# Patient Record
Sex: Female | Born: 1955 | Race: White | Hispanic: No | Smoking: Current every day smoker
Health system: Southern US, Community
[De-identification: ages and names within clinical notes are randomized; demographics above are authoritative.]

## PROBLEM LIST (undated history)

## (undated) DIAGNOSIS — E079 Disorder of thyroid, unspecified: Secondary | ICD-10-CM

## (undated) DIAGNOSIS — F32A Depression, unspecified: Secondary | ICD-10-CM

## (undated) DIAGNOSIS — F419 Anxiety disorder, unspecified: Secondary | ICD-10-CM

## (undated) DIAGNOSIS — I639 Cerebral infarction, unspecified: Secondary | ICD-10-CM

## (undated) DIAGNOSIS — F329 Major depressive disorder, single episode, unspecified: Secondary | ICD-10-CM

## (undated) HISTORY — PX: ABDOMINAL HYSTERECTOMY: SHX81

---

## 2017-02-10 ENCOUNTER — Emergency Department: Payer: Self-pay

## 2017-02-10 ENCOUNTER — Emergency Department
Admission: EM | Admit: 2017-02-10 | Discharge: 2017-02-10 | Disposition: A | Discharge status: Home / Self Care | Payer: Self-pay | Attending: Emergency Medicine | Admitting: Emergency Medicine

## 2017-02-10 ENCOUNTER — Encounter: Payer: Self-pay | Admitting: Emergency Medicine

## 2017-02-10 DIAGNOSIS — F17200 Nicotine dependence, unspecified, uncomplicated: Secondary | ICD-10-CM | POA: Insufficient documentation

## 2017-02-10 DIAGNOSIS — J40 Bronchitis, not specified as acute or chronic: Secondary | ICD-10-CM | POA: Insufficient documentation

## 2017-02-10 HISTORY — DX: Depression, unspecified: F32.A

## 2017-02-10 HISTORY — DX: Major depressive disorder, single episode, unspecified: F32.9

## 2017-02-10 HISTORY — DX: Anxiety disorder, unspecified: F41.9

## 2017-02-10 HISTORY — DX: Disorder of thyroid, unspecified: E07.9

## 2017-02-10 LAB — BASIC METABOLIC PANEL
Anion gap: 6 (ref 5–15)
BUN: 20 mg/dL (ref 6–20)
CALCIUM: 9.5 mg/dL (ref 8.9–10.3)
CO2: 25 mmol/L (ref 22–32)
Chloride: 105 mmol/L (ref 101–111)
Creatinine, Ser: 0.77 mg/dL (ref 0.44–1.00)
GFR calc Af Amer: >60 mL/min (ref >60)
GLUCOSE: 88 mg/dL (ref 65–99)
POTASSIUM: 4.4 mmol/L (ref 3.5–5.1)
Sodium: 136 mmol/L (ref 135–145)

## 2017-02-10 LAB — CBC
HEMATOCRIT: 34.6 % — AB (ref 35.0–47.0)
Hemoglobin: 11.1 g/dL — ABNORMAL LOW (ref 12.0–16.0)
MCH: 24.6 pg — ABNORMAL LOW (ref 26.0–34.0)
MCHC: 32 g/dL (ref 32.0–36.0)
MCV: 76.8 fL — ABNORMAL LOW (ref 80.0–100.0)
PLATELETS: 335 10*3/uL (ref 150–440)
RBC: 4.5 MIL/uL (ref 3.80–5.20)
RDW: 20.9 % — AB (ref 11.5–14.5)
WBC: 7 10*3/uL (ref 3.6–11.0)

## 2017-02-10 LAB — TROPONIN I

## 2017-02-10 MED ORDER — HYDROCOD POLST-CPM POLST ER 10-8 MG/5ML PO SUER
5.0000 mL | Freq: Once | ORAL | Status: AC
  Administered 2017-02-10: 5 mL via ORAL
  Filled 2017-02-10: qty 5

## 2017-02-10 MED ORDER — CYCLOBENZAPRINE HCL 5 MG PO TABS: 5.0000 mg | ORAL_TABLET | Freq: Three times a day (TID) | ORAL | 0 refills | Status: DC | PRN

## 2017-02-10 MED ORDER — PREDNISONE 20 MG PO TABS: 40.0000 mg | ORAL_TABLET | Freq: Every day | ORAL | 0 refills | Status: DC

## 2017-02-10 NOTE — ED Notes (Signed)
Patient given multiple bus passes and information about multiple shelters in [place] and [place].  Patient given information on [facility], [facility], and [facility] management clinic as well.  Patient is in no obvious distress at this time.

## 2017-02-10 NOTE — ED Notes (Signed)
MD in room to discuss discharge.

## 2017-02-10 NOTE — Clinical Social Work Note (Signed)
CSW consulted for homelessness. CSW met with pt to address consult. CSW introduced herself and explained role of social work. Pt lives with her friend in a car. Per pt, about 2 weeks ago, pt was asked to [employer] due to pt not being able to provide receipts for what she used her social security check. Pt is unable to return for 120 days. CSW confirmed with [employer] that she is unable to return. CSW provided [place] and [employer] for shelter, food, and health needs, as well as bus passes. CSW updated RN and MD. Pt is for discharge and she has a ride. CSW is signing off as no further needs identified.   Darden Dates, MSW, LCSW  Clinical Social Worker [phone]

## 2017-02-10 NOTE — ED Triage Notes (Signed)
Pt to ed via ems from the homeless shelter.  Pt recently dx with pneumonia and finished abx but states cough and congestion has continued.  Pt alert and oriented at thsi time. Skin warm and dry, pt with noted thick, wet, cough.

## 2017-02-10 NOTE — ED Provider Notes (Signed)
[facility] Emergency Department Provider Note  Time seen: 1:39 PM  I have reviewed the triage vital signs and the nursing notes.   HISTORY  Chief Complaint Cough and Shortness of Breath    HPI Alexis Montoya is a 61 y.o. female with a past medical history of anxiety, depression, homelessness, presents to the emergency department with continued cough and congestion. According to the patient approximately 2 or 3 weeks ago she was diagnosed with pneumonia and placed on amoxicillin. Patient took her entire course of antibiotics for states he continues to cough and feel congested. Patient states she is homeless and has been sleeping in a car without heat which she believes is causing her symptoms or at least preventing them from resolving. Patient denies any fever. States occasional chest pain when coughing. Denies any shortness of breath at this time.  Past Medical History:  Diagnosis Date  . Anxiety   . Depression   . Thyroid disease     There are no active problems to display for this patient.   History reviewed. No pertinent surgical history.  Prior to Admission medications   Not on File    No Known Allergies  History reviewed. No pertinent family history.  Social History Social History  Substance Use Topics  . Smoking status: Current Every Day Smoker  . Smokeless tobacco: Never Used  . Alcohol use Yes    Review of Systems Constitutional: Negative for fever. Cardiovascular: Occasional chest pain with cough Respiratory: Negative for shortness of breath.Positive for cough occasional yellow sputum production Gastrointestinal: Negative for abdominal pain, vomiting and diarrhea.. Neurological: Negative for headache 10-point ROS otherwise negative.  ____________________________________________   PHYSICAL EXAM:  VITAL SIGNS: ED Triage Vitals  Enc Vitals Group     BP --      Pulse Rate 02/10/17 1312 61     Resp 02/10/17 1312 16     Temp  02/10/17 1312 98.1 F (36.7 C)     Temp Source 02/10/17 1312 Oral     SpO2 02/10/17 1312 100 %     Weight 02/10/17 1312 145 lb (65.8 kg)     Height 02/10/17 1312 5\' 8"  (1.727 m)     Head Circumference --      Peak Flow --      Pain Score 02/10/17 1313 8     Pain Loc --      Pain Edu? --      Excl. in GC? --     Constitutional: Alert and oriented. Well appearing and in no distress. Eyes: Normal exam ENT   Head: Normocephalic and atraumatic.   Mouth/Throat: Mucous membranes are moist. Cardiovascular: Normal rate, regular rhythm. No murmur Respiratory: Normal respiratory effort without tachypnea nor retractions. Breath sounds are clear Gastrointestinal: Soft and nontender. No distention. Musculoskeletal: Nontender with normal range of motion in all extremities.  Neurologic:  Normal speech and language. No gross focal neurologic deficits Skin:  Skin is warm, dry and intact.  Psychiatric: Mood and affect are normal.   ____________________________________________    EKG  EKG reviewed and interpreted by myself shows normal sinus rhythm at 61 bpm, narrow QRS, normal axis, normal intervals, no CT changes.  ____________________________________________    RADIOLOGY   IMPRESSION: No active cardiopulmonary disease.  ____________________________________________   INITIAL IMPRESSION / ASSESSMENT AND PLAN / ED COURSE  Pertinent labs & imaging results that were available during my care of the patient were reviewed by me and considered in my medical decision making (see  chart for details).  He presents the emergency department with continued cough and congestion for the past 2 or 3 weeks. She states she has completed a course of antibiotics without improvement. She blames this on being homeless and living in a car without heat. Patient states she was staying at the shelter but was recently kicked out because she has been there within the last 120 days for patient. Patient is  asking if she can sleep in the emergency department or be admitted to the hospital to sleep in the hospital tonight. Currently the patient appears well from a medical standpoint, normal vitals, 100% room air saturation. Clear lung sounds bilaterally does have occasional cough in the emergency department. We will check labs, chest x-ray and continue to closely monitor. We will have the social worker see the patient to see if there are any options for her to go to a different shelter, etc.  Patient's workup is largely normal. Chest x-ray is negative. Given the patient's report of continued cough and was discharged with a course of steroids for possible bronchitis. Social worker has seen the patient and provided resources for the patient.    ____________________________________________   FINAL CLINICAL IMPRESSION(S) / ED DIAGNOSES  Bronchitis    Minna Antis, MD 02/10/17 [phone]

## 2017-02-10 NOTE — ED Notes (Signed)
This RN called [facility] regarding patient.  Will continue to monitor.

## 2017-02-11 ENCOUNTER — Emergency Department
Admission: EM | Admit: 2017-02-11 | Discharge: 2017-02-11 | Disposition: A | Discharge status: Home / Self Care | Payer: Self-pay | Attending: Emergency Medicine | Admitting: Emergency Medicine

## 2017-02-11 DIAGNOSIS — F10920 Alcohol use, unspecified with intoxication, uncomplicated: Secondary | ICD-10-CM

## 2017-02-11 DIAGNOSIS — Y999 Unspecified external cause status: Secondary | ICD-10-CM | POA: Insufficient documentation

## 2017-02-11 DIAGNOSIS — Y9389 Activity, other specified: Secondary | ICD-10-CM | POA: Insufficient documentation

## 2017-02-11 DIAGNOSIS — Z5181 Encounter for therapeutic drug level monitoring: Secondary | ICD-10-CM | POA: Insufficient documentation

## 2017-02-11 DIAGNOSIS — Y92410 Unspecified street and highway as the place of occurrence of the external cause: Secondary | ICD-10-CM | POA: Insufficient documentation

## 2017-02-11 DIAGNOSIS — F10120 Alcohol abuse with intoxication, uncomplicated: Secondary | ICD-10-CM | POA: Insufficient documentation

## 2017-02-11 DIAGNOSIS — F17200 Nicotine dependence, unspecified, uncomplicated: Secondary | ICD-10-CM | POA: Insufficient documentation

## 2017-02-11 LAB — COMPREHENSIVE METABOLIC PANEL
ALT: 20 U/L (ref 14–54)
AST: 23 U/L (ref 15–41)
Albumin: 4.4 g/dL (ref 3.5–5.0)
Alkaline Phosphatase: 37 U/L — ABNORMAL LOW (ref 38–126)
Anion gap: 10 (ref 5–15)
BILIRUBIN TOTAL: 0.5 mg/dL (ref 0.3–1.2)
BUN: 16 mg/dL (ref 6–20)
CALCIUM: 9.8 mg/dL (ref 8.9–10.3)
CHLORIDE: 105 mmol/L (ref 101–111)
CO2: 21 mmol/L — ABNORMAL LOW (ref 22–32)
CREATININE: 0.8 mg/dL (ref 0.44–1.00)
Glucose, Bld: 130 mg/dL — ABNORMAL HIGH (ref 65–99)
Potassium: 3.8 mmol/L (ref 3.5–5.1)
Sodium: 136 mmol/L (ref 135–145)
TOTAL PROTEIN: 7.7 g/dL (ref 6.5–8.1)

## 2017-02-11 LAB — CBC
HCT: 34.5 % — ABNORMAL LOW (ref 35.0–47.0)
Hemoglobin: 11.4 g/dL — ABNORMAL LOW (ref 12.0–16.0)
MCH: 25.2 pg — ABNORMAL LOW (ref 26.0–34.0)
MCHC: 32.9 g/dL (ref 32.0–36.0)
MCV: 76.7 fL — ABNORMAL LOW (ref 80.0–100.0)
Platelets: 378 10*3/uL (ref 150–440)
RBC: 4.51 MIL/uL (ref 3.80–5.20)
RDW: 20.8 % — ABNORMAL HIGH (ref 11.5–14.5)
WBC: 9.5 10*3/uL (ref 3.6–11.0)

## 2017-02-11 LAB — URINE DRUG SCREEN, QUALITATIVE (ARMC ONLY)
Amphetamines, Ur Screen: NOT DETECTED
BARBITURATES, UR SCREEN: NOT DETECTED
Benzodiazepine, Ur Scrn: NOT DETECTED
COCAINE METABOLITE, UR ~~LOC~~: NOT DETECTED
Cannabinoid 50 Ng, Ur ~~LOC~~: NOT DETECTED
MDMA (Ecstasy)Ur Screen: NOT DETECTED
METHADONE SCREEN, URINE: NOT DETECTED
OPIATE, UR SCREEN: NOT DETECTED
PHENCYCLIDINE (PCP) UR S: NOT DETECTED
Tricyclic, Ur Screen: NOT DETECTED

## 2017-02-11 LAB — ETHANOL: Alcohol, Ethyl (B): 276 mg/dL — ABNORMAL HIGH (ref <5)

## 2017-02-11 NOTE — Discharge Instructions (Signed)
THE PATIENT IS MEDICALLY AND PSYCHOLOGICALLY STABLE FOR BOOKING.  SHE HAS NO ACUTE MEDICAL ISSUES THAT REQUIRE HOSPITALIZATION OR FURTHER EVALUATION IN THE EMERGENCY DEPARTMENT AT THIS TIME.  It was a pleasure to take care of you today, and thank you for coming to our emergency department.  If you have any questions or concerns before leaving please ask the nurse to grab me and I'm more than happy to go through your aftercare instructions again.  If you were prescribed any opioid pain medication today such as Norco, Vicodin, Percocet, morphine, hydrocodone, or oxycodone please make sure you do not drive when you are taking this medication as it can alter your ability to drive safely.  If you have any concerns once you are home that you are not improving or are in fact getting worse before you can make it to your follow-up appointment, please do not hesitate to call 911 and come back for further evaluation.  Merrily BrittleNeil Porcha Deblanc MD  Results for orders placed or performed during the hospital encounter of 02/11/17  Ethanol  Result Value Ref Range   Alcohol, Ethyl (B) 276 (H) <5 mg/dL  cbc  Result Value Ref Range   WBC 9.5 3.6 - 11.0 K/uL   RBC 4.51 3.80 - 5.20 MIL/uL   Hemoglobin 11.4 (L) 12.0 - 16.0 g/dL   HCT 16.134.5 (L) 09.635.0 - 04.547.0 %   MCV 76.7 (L) 80.0 - 100.0 fL   MCH 25.2 (L) 26.0 - 34.0 pg   MCHC 32.9 32.0 - 36.0 g/dL   RDW 40.920.8 (H) 81.111.5 - 91.414.5 %   Platelets 378 150 - 440 K/uL   Dg Chest 2 View  Result Date: 02/10/2017 CLINICAL DATA:  Cough. EXAM: CHEST  2 VIEW COMPARISON:  Radiograph of January 27, 2017. FINDINGS: The heart size and mediastinal contours are within normal limits. Both lungs are clear. No pneumothorax or pleural effusion is noted. Atherosclerosis of thoracic aorta is noted. The visualized skeletal structures are unremarkable. IMPRESSION: No active cardiopulmonary disease. Electronically Signed   By: Lupita RaiderJames  Green Jr, M.D.   On: 02/10/2017 14:05
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
## 2017-02-11 NOTE — ED Notes (Addendum)
Green beaded necklace, tie dye rubber bracelet, gray hair tie, 5 metal rings. 1 with green stone, 1 with 14 small red stones, 1 plain, 1 with horizontal figure 8, 1 that overlaps. 1 metal bracelet with green stone. Placed in pts belonging bag. Pt let this RN dress her out

## 2017-02-11 NOTE — ED Notes (Signed)
During triage, pt sts "I don't want to live like this"

## 2017-02-11 NOTE — ED Notes (Signed)
Forensic blood draw done by [employer] RN
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
## 2017-02-14 ENCOUNTER — Emergency Department
Admission: EM | Admit: 2017-02-14 | Discharge: 2017-02-14 | Disposition: A | Discharge status: Home / Self Care | Attending: Emergency Medicine | Admitting: Emergency Medicine

## 2017-02-14 ENCOUNTER — Emergency Department

## 2017-02-14 ENCOUNTER — Encounter: Payer: Self-pay | Admitting: *Deleted

## 2017-02-14 DIAGNOSIS — X501XXA Overexertion from prolonged static or awkward postures, initial encounter: Secondary | ICD-10-CM | POA: Insufficient documentation

## 2017-02-14 DIAGNOSIS — S52135A Nondisplaced fracture of neck of left radius, initial encounter for closed fracture: Secondary | ICD-10-CM | POA: Insufficient documentation

## 2017-02-14 DIAGNOSIS — Y999 Unspecified external cause status: Secondary | ICD-10-CM | POA: Insufficient documentation

## 2017-02-14 DIAGNOSIS — J40 Bronchitis, not specified as acute or chronic: Secondary | ICD-10-CM | POA: Insufficient documentation

## 2017-02-14 DIAGNOSIS — Y939 Activity, unspecified: Secondary | ICD-10-CM | POA: Insufficient documentation

## 2017-02-14 DIAGNOSIS — Y929 Unspecified place or not applicable: Secondary | ICD-10-CM | POA: Insufficient documentation

## 2017-02-14 MED ORDER — PREDNISONE 10 MG PO TABS: 40.0000 mg | ORAL_TABLET | Freq: Every day | ORAL | 0 refills | Status: AC

## 2017-02-14 MED ORDER — IBUPROFEN 800 MG PO TABS
800.0000 mg | ORAL_TABLET | Freq: Once | ORAL | Status: AC
  Administered 2017-02-14: 800 mg via ORAL
  Filled 2017-02-14: qty 1
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
## 2017-02-26 ENCOUNTER — Emergency Department

## 2017-02-26 ENCOUNTER — Emergency Department
Admission: EM | Admit: 2017-02-26 | Discharge: 2017-02-26 | Disposition: A | Discharge status: Home / Self Care | Attending: Emergency Medicine | Admitting: Emergency Medicine

## 2017-02-26 ENCOUNTER — Encounter: Payer: Self-pay | Admitting: Emergency Medicine

## 2017-02-26 DIAGNOSIS — G43109 Migraine with aura, not intractable, without status migrainosus: Secondary | ICD-10-CM

## 2017-02-26 DIAGNOSIS — F17200 Nicotine dependence, unspecified, uncomplicated: Secondary | ICD-10-CM | POA: Diagnosis not present

## 2017-02-26 DIAGNOSIS — R202 Paresthesia of skin: Secondary | ICD-10-CM | POA: Insufficient documentation

## 2017-02-26 DIAGNOSIS — R29810 Facial weakness: Secondary | ICD-10-CM

## 2017-02-26 DIAGNOSIS — G43809 Other migraine, not intractable, without status migrainosus: Secondary | ICD-10-CM | POA: Insufficient documentation

## 2017-02-26 DIAGNOSIS — R519 Headache, unspecified: Secondary | ICD-10-CM

## 2017-02-26 DIAGNOSIS — R51 Headache: Secondary | ICD-10-CM

## 2017-02-26 DIAGNOSIS — R200 Anesthesia of skin: Secondary | ICD-10-CM

## 2017-02-26 DIAGNOSIS — Z5181 Encounter for therapeutic drug level monitoring: Secondary | ICD-10-CM | POA: Diagnosis not present

## 2017-02-26 HISTORY — DX: Cerebral infarction, unspecified: I63.9

## 2017-02-26 LAB — CBC
HCT: 38.8 % (ref 35.0–47.0)
Hemoglobin: 12.6 g/dL (ref 12.0–16.0)
MCH: 25.8 pg — ABNORMAL LOW (ref 26.0–34.0)
MCHC: 32.5 g/dL (ref 32.0–36.0)
MCV: 79.2 fL — ABNORMAL LOW (ref 80.0–100.0)
Platelets: 275 10*3/uL (ref 150–440)
RBC: 4.9 MIL/uL (ref 3.80–5.20)
RDW: 21.6 % — AB (ref 11.5–14.5)
WBC: 10.2 10*3/uL (ref 3.6–11.0)

## 2017-02-26 LAB — DIFFERENTIAL
BASOS PCT: 1 %
Basophils Absolute: 0.1 10*3/uL (ref 0–0.1)
EOS PCT: 2 %
Eosinophils Absolute: 0.2 10*3/uL (ref 0–0.7)
LYMPHS PCT: 15 %
Lymphs Abs: 1.5 10*3/uL (ref 1.0–3.6)
Monocytes Absolute: 1.1 10*3/uL — ABNORMAL HIGH (ref 0.2–0.9)
Monocytes Relative: 10 %
NEUTROS ABS: 7.4 10*3/uL — AB (ref 1.4–6.5)
NEUTROS PCT: 72 %

## 2017-02-26 LAB — COMPREHENSIVE METABOLIC PANEL
ALBUMIN: 4.2 g/dL (ref 3.5–5.0)
ALK PHOS: 44 U/L (ref 38–126)
ALT: 15 U/L (ref 14–54)
ANION GAP: 8 (ref 5–15)
AST: 19 U/L (ref 15–41)
BUN: 16 mg/dL (ref 6–20)
CALCIUM: 9.9 mg/dL (ref 8.9–10.3)
CHLORIDE: 101 mmol/L (ref 101–111)
CO2: 29 mmol/L (ref 22–32)
Creatinine, Ser: 0.8 mg/dL (ref 0.44–1.00)
GFR calc non Af Amer: >60 mL/min (ref >60)
GLUCOSE: 75 mg/dL (ref 65–99)
Potassium: 3.9 mmol/L (ref 3.5–5.1)
SODIUM: 138 mmol/L (ref 135–145)
Total Bilirubin: 0.5 mg/dL (ref 0.3–1.2)
Total Protein: 7.6 g/dL (ref 6.5–8.1)

## 2017-02-26 LAB — PROTIME-INR
INR: 0.84
Prothrombin Time: 11.5 seconds (ref 11.4–15.2)

## 2017-02-26 LAB — TROPONIN I: Troponin I: <0.03 ng/mL (ref <0.03)

## 2017-02-26 LAB — APTT: aPTT: 32 seconds (ref 24–36)

## 2017-02-26 MED ORDER — ACETAMINOPHEN 500 MG PO TABS
1000.0000 mg | ORAL_TABLET | Freq: Once | ORAL | Status: AC
  Administered 2017-02-26: 1000 mg via ORAL

## 2017-02-26 MED ORDER — DIPHENHYDRAMINE HCL 50 MG/ML IJ SOLN
25.0000 mg | Freq: Once | INTRAMUSCULAR | Status: AC
  Administered 2017-02-26: 25 mg via INTRAVENOUS
  Filled 2017-02-26: qty 1

## 2017-02-26 MED ORDER — IOPAMIDOL (ISOVUE-370) INJECTION 76%
75.0000 mL | Freq: Once | INTRAVENOUS | Status: AC | PRN
  Administered 2017-02-26: 75 mL via INTRAVENOUS

## 2017-02-26 MED ORDER — ACETAMINOPHEN 500 MG PO TABS
ORAL_TABLET | ORAL | Status: AC
  Filled 2017-02-26: qty 2

## 2017-02-26 MED ORDER — KETOROLAC TROMETHAMINE 30 MG/ML IJ SOLN
30.0000 mg | Freq: Once | INTRAMUSCULAR | Status: AC
  Administered 2017-02-26: 30 mg via INTRAVENOUS
  Filled 2017-02-26: qty 1

## 2017-02-26 MED ORDER — METOCLOPRAMIDE HCL 5 MG/ML IJ SOLN
10.0000 mg | Freq: Once | INTRAMUSCULAR | Status: AC
  Administered 2017-02-26: 10 mg via INTRAVENOUS
  Filled 2017-02-26: qty 2

## 2017-02-26 MED ORDER — MAGNESIUM SULFATE IN D5W 1-5 GM/100ML-% IV SOLN
1.0000 g | Freq: Once | INTRAVENOUS | Status: AC
  Administered 2017-02-26: 1 g via INTRAVENOUS
  Filled 2017-02-26: qty 100

## 2017-02-26 MED ORDER — ASPIRIN 81 MG PO CHEW
81.0000 mg | CHEWABLE_TABLET | Freq: Once | ORAL | Status: AC
  Administered 2017-02-26: 81 mg via ORAL
  Filled 2017-02-26: qty 1

## 2017-02-26 MED ORDER — DEXAMETHASONE SODIUM PHOSPHATE 10 MG/ML IJ SOLN
10.0000 mg | Freq: Once | INTRAMUSCULAR | Status: AC
  Administered 2017-02-26: 10 mg via INTRAVENOUS
  Filled 2017-02-26: qty 1

## 2017-02-26 MED ORDER — SODIUM CHLORIDE 0.9 % IV BOLUS (SEPSIS)
1000.0000 mL | Freq: Once | INTRAVENOUS | Status: AC
  Administered 2017-02-26: 1000 mL via INTRAVENOUS

## 2017-02-26 NOTE — ED Notes (Signed)
Pt ambulated to toilet without difficulty 

## 2017-02-26 NOTE — ED Notes (Signed)
Pt in mri 

## 2017-02-26 NOTE — Discharge Instructions (Signed)
You were evaluated for headache and left-sided numbness and tingling which is resolved. As we discussed, your symptoms seem most consistent with a complicated migraine.  Your exam and evaluation were reassuring for no evidence of stroke today. Next the next line return to the emergency department immediately for any worsening condition including worsening headache, confusion or altered mental status, weakness, numbness, slurred speech, or any other symptoms concerning to you.  Please follow up with a neurologist as well as a primary care doctor.
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
## 2017-02-26 NOTE — ED Provider Notes (Addendum)
[facility] Emergency Department Provider Note ____________________________________________   I have reviewed the triage vital signs and the triage nursing note.  HISTORY  Chief Complaint Facial Droop   Historian Patient  HPI Alexis Montoya is a 61 y.o. female [facility] jail, reports a history of complex migraine, as well as history of TIA per the patient, although states that she is not on any blood thinner, presents with headache on and off for about 1 week which she thought has been having a migraine. She woke up ok with mild headache but no neurologic deficits this morning and had breakfast.  At around 8:30 patient reported acute worsening of mild headacheto severe headache 10/10 with nausea and felt left facial weakness./droop.  Then also noticed sensory changes, numbness left arm and leg.  Also reports trouble with standing, unclear if due to headache or weakness.  No chest pain or trouble breathing.  Patient states she had an episode like this about 4 months ago which was ultimately diagnosed with complicated migraine and possible TIA.    Past Medical History:  Diagnosis Date  . Anxiety   . Depression   . Stroke (HCC)   . Thyroid disease     There are no active problems to display for this patient.   Past Surgical History:  Procedure Laterality Date  . ABDOMINAL HYSTERECTOMY      Prior to Admission medications   Not on File    Allergies  Allergen Reactions  . Wool Alcohol [Lanolin] Hives and Shortness Of Breath    No family history on file.  Social History Social History  Substance Use Topics  . Smoking status: Current Every Day Smoker  . Smokeless tobacco: Never Used  . Alcohol use Yes    Review of Systems  Constitutional: Negative for fever. Eyes: Negative for visual changes. ENT: Negative for sore throat. Cardiovascular: Negative for chest pain. Respiratory: Negative for shortness of breath. Gastrointestinal:  Negative for abdominal pain, vomiting and diarrhea. Genitourinary: Negative for dysuria. Musculoskeletal: Negative for back pain. Skin: Negative for rash. Neurological: Positive for headache. 10 point Review of Systems otherwise negative ____________________________________________   PHYSICAL EXAM:  VITAL SIGNS: ED Triage Vitals  Enc Vitals Group     BP 02/26/17 1013 129/72     Pulse Rate 02/26/17 1013 69     Resp 02/26/17 1013 18     Temp 02/26/17 1013 97.9 F (36.6 C)     Temp Source 02/26/17 1013 Oral     SpO2 02/26/17 1013 99 %     Weight 02/26/17 1014 153 lb (69.4 kg)     Height 02/26/17 1014  (1.727 m)     Head Circumference --      Peak Flow --      Pain Score 02/26/17 1012 10     Pain Loc --      Pain Edu? --      Excl. in GC? --      Constitutional: Alert and oriented. Closing her eyes and wincing at times complaining of a headache. Cooperative. HEENT   Head: Normocephalic and atraumatic.      Eyes: Conjunctivae are normal. PERRL. Normal extraocular movements.      Ears:         Nose: No congestion/rhinnorhea.   Mouth/Throat: Mucous membranes are moist.   Neck: No stridor. Cardiovascular/Chest: Normal rate, regular rhythm.  No murmurs, rubs, or gallops. Respiratory: Normal respiratory effort without tachypnea nor retractions. Breath sounds are clear and equal  bilaterally. No wheezes/rales/rhonchi. Gastrointestinal: Soft. No distention, no guarding, no rebound. Nontender.    Genitourinary/rectal:Deferred Musculoskeletal: Nontender with normal range of motion in all extremities. No joint effusions.  No lower extremity tenderness.  No edema. Neurologic: Modified left-sided nasolabial fold. No sensory deficit on the face. Patient reports paresthesia left upper arm and fingers, she does have a cast on the elbow. Possible slight weakness of the left lower extremity with comparison to the right, but she is able to hold it without dropping to bed. She is  able to raise her arm despite wearing the cast although may be slightly less than usual per her. Skin:  Skin is warm, dry and intact. No rash noted. Psychiatric: Mood and affect are normal. Speech and behavior are normal. Patient exhibits appropriate insight and judgment.   ____________________________________________  LABS (pertinent positives/negatives)  Labs Reviewed  CBC - Abnormal; Notable for the following:       Result Value   MCV 79.2 (*)    MCH 25.8 (*)    RDW 21.6 (*)    All other components within normal limits  DIFFERENTIAL - Abnormal; Notable for the following:    Neutro Abs 7.4 (*)    Monocytes Absolute 1.1 (*)    All other components within normal limits  PROTIME-INR  APTT  COMPREHENSIVE METABOLIC PANEL  TROPONIN I  CBG MONITORING, ED    ____________________________________________    EKG I, Governor Rooks, MD, the attending physician have personally viewed and interpreted all ECGs.  72 bpm normal sinus rhythm. No acute distress. Normal axis. Normal ST and T-wave ____________________________________________  RADIOLOGY All Xrays were viewed by me. Imaging interpreted by Radiologist.  CT head without contrast:  IMPRESSION: No acute finding. ASPECTS is 10.  Discussed with radiologist   CTA head and neck:  IMPRESSION: 1. Negative for emergent large vessel occlusion. 2. No cervical or intracranial flow limiting stenosis noted. 3. 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 ~40% left subclavian origin stenosis. 4. Left vertebral artery arises from the arch. Atheromatous narrowing at the origin, with stenosis estimation limited by motion.   MRI brain without contrast:  IMPRESSION: No acute intracranial process.  Mild parenchymal brain volume loss for age. __________________________________________  PROCEDURES  Procedure(s) performed: None  Critical Care performed: CRITICAL CARE Performed by: Governor Rooks   Total critical care time: 30  minutes  Critical care time was exclusive of separately billable procedures and treating other patients.  Critical care was necessary to treat or prevent imminent or life-threatening deterioration.  Critical care was time spent personally by me on the following activities: development of treatment plan with patient and/or surrogate as well as nursing, discussions with consultants, evaluation of patient's response to treatment, examination of patient, obtaining history from patient or surrogate, ordering and performing treatments and interventions, ordering and review of laboratory studies, ordering and review of radiographic studies, pulse oximetry and re-evaluation of patient's condition.   ____________________________________________   ED COURSE / ASSESSMENT AND PLAN  Pertinent labs & imaging results that were available during my care of the patient were reviewed by me and considered in my medical decision making (see chart for details).    Proceeded to take history and physical exam, she is complaining of sensory changes on the left arm and leg and weakness of left face.  Questionable left arm and left leg weakness.  Testing for facial muscles is a little inconsistent but mild left sided droop.  Time of onset was a little difficult to pin down , but  after sure if called the jail to determine time of breakfast and time of patient calling for help, settle on 8:30 as time of onset/last seen normal.  I did initiate code stroke based on the symptoms.  Initially the patient should history stroke, but then later told the neurologist that she was ultimately diagnosed with convex migraine and then told me that they settled on "possible mini stroke."    NIH score for her neurologist was 2.  Patient's exam is a little inconsistent. Given the significant amount of headache, similar prior presentation felt to be complex migraine, somewhat inconsistent exam, and ultimately relatively minor exam  findings, patient was offered the range of options including TPA. After discussion of risks and benefits, patient elected to decline TPA.  I think this is a reasonable decision.  Patient was started on medications for migraine to help with her most pressing complaint which was headache.  Symptoms do not seem consistent with subarachnoid hemorrhage, with waxing and waning, headache for about a week with this sudden worsening.  Reviewed recommendations by tele neurologist - ct angio head/neck and I will give aspirin.  Admit.  CT head/neck without large vessel occlusion.  HA somewhat improved but still reported as severe, I did add toradol. Patient is asking for food.   CONSULTATIONS:  Radiologist by phone, tele-neurologist by video conference, hospitalist for admission.   Patient / Family / Caregiver informed of clinical course, medical decision-making process, and agree with plan.  Addended to include I spoke with hospitalist Dr. Cherlynn Kaiser, plan to obtain MRI, if positive for stroke will admit, if negative recommends discharge from hospital.  At 3 PM, patient has eaten and has no neurologic deficits, and is interacting appropriately, states that she still is a headache, and is now reporting it had gone down to 7 out of 10 not back to 9 out of 10. She looks more comfortable now than she did when she came in.  Patient was given a dose of IV magnesium.  She did at one point request IV narcotics, and I did discuss with her that narcotics were not indicated in the treatment of migraine. We discussed that I would not be treating with narcotics.  She has had significant improvement over her ED stay. I don't think that she warrants hospital admission. I'm worried about the secondary gain even if are not treating with narcotics in the hospital, simply of being in the hospital overnight. She does not appear to have ongoing emergency medical condition, and I think it's reasonable to discharge with  return precautions for neurologic condition change.  Patient finishing IV magnesium. If no acute change, patient to be discharged with appropriate discharge instructions. ED care transferred to Dr. Lamont Snowball. I have printed the instructions.   Discharge instructions You were evaluated for headache and left-sided numbness and tingling which is resolved. As we discussed, your symptoms seem most consistent with a complicated migraine.  Your exam and evaluation were reassuring for no evidence of stroke today. Next the next line return to the emergency department immediately for any worsening condition including worsening headache, confusion or altered mental status, weakness, numbness, slurred speech, or any other symptoms concerning to you.  Please follow up with a neurologist as well as a primary care doctor. ___________________________________________   FINAL CLINICAL IMPRESSION(S) / ED DIAGNOSES   Final diagnoses:  Facial droop  Acute nonintractable headache, unspecified headache type  Numbness and tingling of left arm and leg  Complicated migraine  Note: This dictation was prepared with Dragon dictation. Any transcriptional errors that result from this process are unintentional    Governor Rooks, MD 02/26/17 1311    Governor Rooks, MD 02/26/17 1544    Governor Rooks, MD 02/26/17 [phone]

## 2017-02-26 NOTE — ED Notes (Signed)
Doctor talking to pt on soc

## 2017-04-09 ENCOUNTER — Encounter: Payer: Self-pay | Admitting: Medical Oncology

## 2017-04-09 ENCOUNTER — Emergency Department
Admission: EM | Admit: 2017-04-09 | Discharge: 2017-04-09 | Disposition: A | Discharge status: Home / Self Care | Attending: Emergency Medicine | Admitting: Emergency Medicine

## 2017-04-09 DIAGNOSIS — L2389 Allergic contact dermatitis due to other agents: Secondary | ICD-10-CM | POA: Insufficient documentation

## 2017-04-09 DIAGNOSIS — F17200 Nicotine dependence, unspecified, uncomplicated: Secondary | ICD-10-CM | POA: Insufficient documentation

## 2017-04-09 DIAGNOSIS — Z8673 Personal history of transient ischemic attack (TIA), and cerebral infarction without residual deficits: Secondary | ICD-10-CM | POA: Insufficient documentation

## 2017-04-09 MED ORDER — HYDROXYZINE HCL 50 MG PO TABS: 50.0000 mg | ORAL_TABLET | Freq: Three times a day (TID) | ORAL | 0 refills | Status: AC | PRN

## 2017-04-09 MED ORDER — HYDROXYZINE HCL 50 MG PO TABS
50.0000 mg | ORAL_TABLET | Freq: Once | ORAL | Status: AC
  Administered 2017-04-09: 50 mg via ORAL
  Filled 2017-04-09: qty 1

## 2017-04-09 MED ORDER — TRAMADOL HCL 50 MG PO TABS
50.0000 mg | ORAL_TABLET | Freq: Once | ORAL | Status: AC
  Administered 2017-04-09: 50 mg via ORAL
  Filled 2017-04-09: qty 1

## 2017-04-09 MED ORDER — METHYLPREDNISOLONE SODIUM SUCC 125 MG IJ SOLR
125.0000 mg | Freq: Once | INTRAMUSCULAR | Status: AC
  Administered 2017-04-09: 125 mg via INTRAMUSCULAR
  Filled 2017-04-09: qty 2

## 2017-04-09 MED ORDER — METHYLPREDNISOLONE 4 MG PO TBPK: ORAL_TABLET | ORAL | 0 refills | Status: AC

## 2017-04-09 NOTE — ED Provider Notes (Signed)
[facility] Emergency Department Provider Note   ____________________________________________   First MD Initiated Contact with Patient 04/09/17 1141     (approximate)  I have reviewed the triage vital signs and the nursing notes.   HISTORY  Chief Complaint Rash    HPI Alexis Montoya is a 61 y.o. female patient state last night she been having the itching sensation and woke up this morning for rash all over her body. Patient stated for the past 4 days she has slept a different locations secondary to being homeless. Denies new personal hygiene neurologic products. Denies new food or drinks. Patient stated no relief using over-the-counter ibuprofen and Benadryl for her complaint.patient rates her discomfort as a 9/10.  Past Medical History:  Diagnosis Date  . Anxiety   . Depression   . Stroke (HCC)   . Thyroid disease     There are no active problems to display for this patient.   Past Surgical History:  Procedure Laterality Date  . ABDOMINAL HYSTERECTOMY      Prior to Admission medications   Medication Sig Start Date End Date Taking? Authorizing Provider  hydrOXYzine (ATARAX/VISTARIL) 50 MG tablet Take 1 tablet (50 mg total) by mouth 3 (three) times daily as needed. 04/09/17   Joni ReiningSmith, Ronald K, PA-C  methylPREDNISolone (MEDROL DOSEPAK) 4 MG TBPK tablet Take Tapered dose as directed 04/09/17   Joni ReiningSmith, Ronald K, PA-C    Allergies Wool alcohol [lanolin]  No family history on file.  Social History Social History  Substance Use Topics  . Smoking status: Current Every Day Smoker  . Smokeless tobacco: Never Used  . Alcohol use Yes    Review of Systems  Constitutional: No fever/chills Eyes: No visual changes. ENT: No sore throat. Cardiovascular: Denies chest pain. Respiratory: Denies shortness of breath. Gastrointestinal: No abdominal pain.  No nausea, no vomiting.  No diarrhea.  No constipation. Genitourinary: Negative for  dysuria. Musculoskeletal: Negative for back pain. Skin: Negative for rash. Neurological: Negative for headaches, focal weakness or numbness. Psychiatric:anxiety and depression Allergic/Immunilogical: Pricilla HandlerWool  ____________________________________________   PHYSICAL EXAM:  VITAL SIGNS: ED Triage Vitals  Enc Vitals Group     BP 04/09/17 1057 110/69     Pulse Rate 04/09/17 1057 (!) 104     Resp 04/09/17 1057 18     Temp 04/09/17 1057 98.3 F (36.8 C)     Temp Source 04/09/17 1057 Oral     SpO2 04/09/17 1057 99 %     Weight 04/09/17 1054 153 lb (69.4 kg)     Height --      Head Circumference --      Peak Flow --      Pain Score 04/09/17 1054 9     Pain Loc --      Pain Edu? --      Excl. in GC? --     Constitutional: Alert and oriented. Anxious. Cardiovascular: Normal rate, regular rhythm. Grossly normal heart sounds.  Good peripheral circulation. Respiratory: Normal respiratory effort.  No retractions. Lungs CTAB. Gastrointestinal:Musculoskeletal: No lower extremity tenderness nor edema.  No joint effusions. Neurologic:  Normal speech and language. No gross focal neurologic deficits are appreciated. No gait instability. Skin:  Skin is warm, dry and intact.diffuse macular papular lesions. Psychiatric: Mood and affect are normal. Speech and behavior are normal.  ____________________________________________   LABS (all labs ordered are listed, but only abnormal results are displayed)  Labs Reviewed - No data to display ____________________________________________  EKG   ____________________________________________  RADIOLOGY   ____________________________________________   PROCEDURES  Procedure(s) performed: None  Procedures  Critical Care performed: No  ____________________________________________   INITIAL IMPRESSION / ASSESSMENT AND PLAN / ED COURSE  Pertinent labs & imaging results that were available during my care of the patient were reviewed by me  and considered in my medical decision making (see chart for details).  Contact dermatitis. Patient given discharge care instructions. Patient advised follow "clinic condition persists.      ____________________________________________   FINAL CLINICAL IMPRESSION(S) / ED DIAGNOSES  Final diagnoses:  Allergic contact dermatitis due to other agents      NEW MEDICATIONS STARTED DURING THIS VISIT:  New Prescriptions   HYDROXYZINE (ATARAX/VISTARIL) 50 MG TABLET    Take 1 tablet (50 mg total) by mouth 3 (three) times daily as needed.   METHYLPREDNISOLONE (MEDROL DOSEPAK) 4 MG TBPK TABLET    Take Tapered dose as directed     Note:  This document was prepared using Dragon voice recognition software and may include unintentional dictation errors.    Joni Reining, PA-C 04/09/17 1226    Governor Rooks, MD 04/09/17 [id number]

## 2017-04-09 NOTE — ED Notes (Addendum)
See triage note. States she developed rash,itching  And body aches for the past 4 days states  She has used OTC ibu,and benadryl w/o relief

## 2017-04-09 NOTE — ED Triage Notes (Signed)
Pt reports she began last night having pain all over, this am she woke up with rash all over that is itching, pt in no distress. Pt also reports wrist pain.

## 2018-12-29 IMAGING — CT CT ANGIO NECK
1 of 8 series · 6 of 33 positions shown · IV contrast (APPLIED)
Comparison: None.

CLINICAL DATA: Headache and left-sided facial droop.

EXAM:
CT ANGIOGRAPHY HEAD AND NECK
TECHNIQUE: Multidetector CT imaging of the head and neck was performed using
the standard protocol during bolus administration of intravenous
contrast. Multiplanar CT image reconstructions and MIPs were
obtained to evaluate the vascular anatomy. Carotid stenosis
measurements (when applicable) are obtained utilizing NASCET
criteria, using the distal internal carotid diameter as the
denominator.
CONTRAST:  75 cc Isovue 370 intravenous

[Series 6: ax thin · axial · 0.39mm/px · z∈[-328,-74]mm · 6 of 356 slices shown]
[im 51/356  soft-tissue]
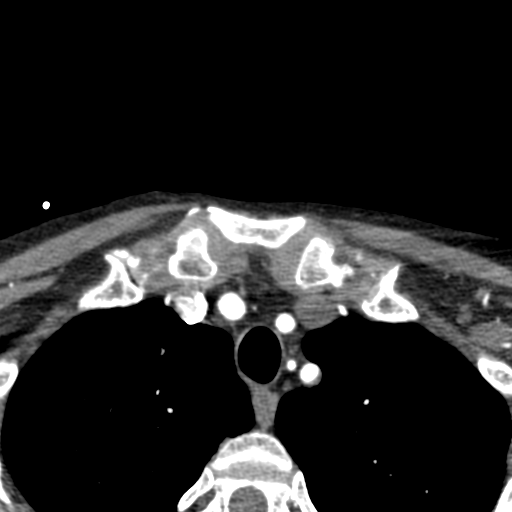
[im 102/356  bone]
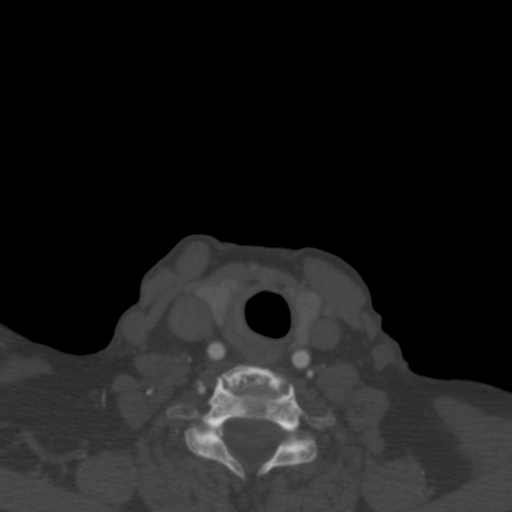
[im 153/356  soft-tissue]
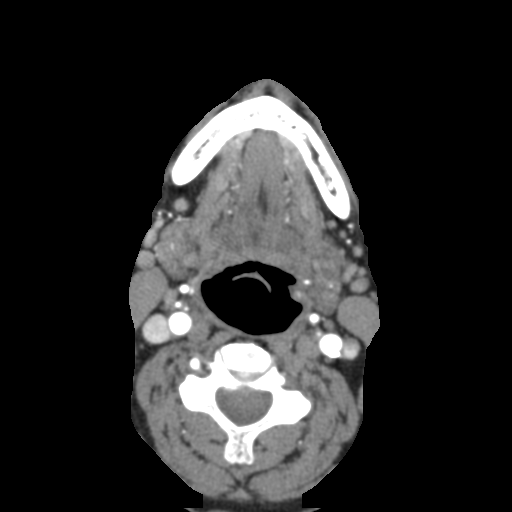
[im 203/356  bone]
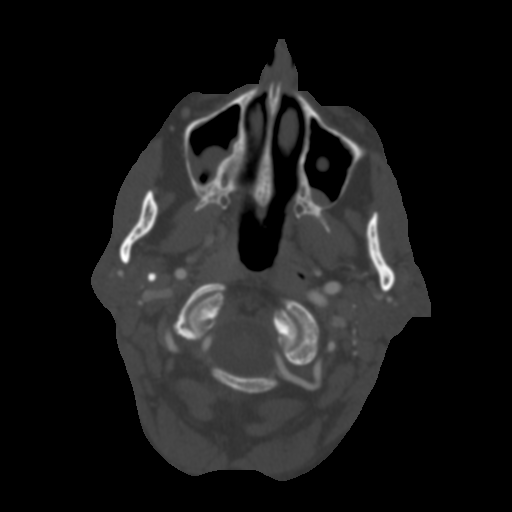
[im 254/356  soft-tissue]
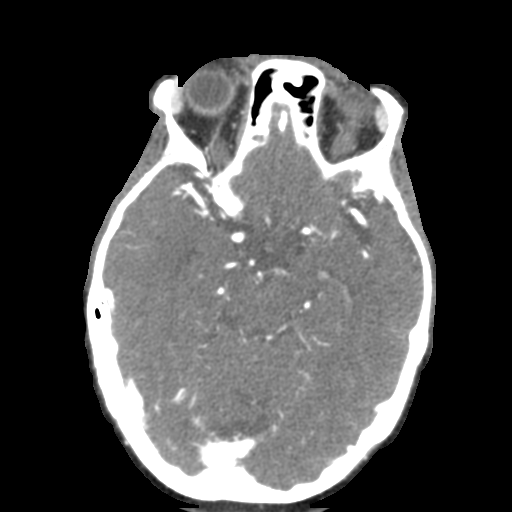
[im 305/356  bone]
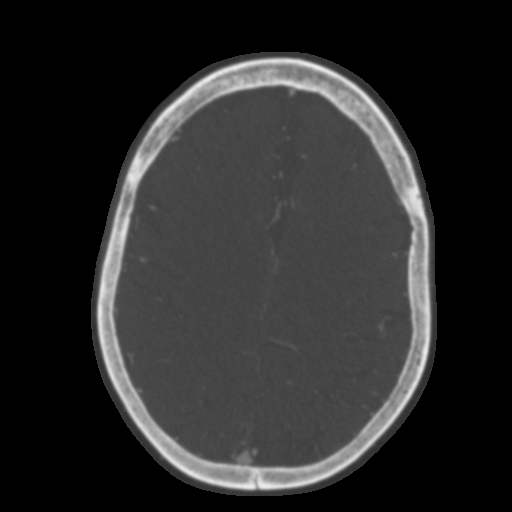

[6 of 33 positions shown; findings below may reference images not displayed]

FINDINGS: CTA NECK FINDINGS

Aortic arch: Atherosclerotic plaque without acute finding. Four
vessel branching pattern

Right carotid system: Prominent plaque at the brachiocephalic ostium
with approximately 50% narrowing. Mild atheromatous plaque at the
common carotid bifurcation without stenosis or ulceration. Negative
for dissection. ICA tortuosity with looping.

Left carotid system: Calcified plaque at the common carotid ostium
without stenosis. Mild calcified and noncalcified plaque at the
distal common carotid and bulb without stenosis, ulceration, or
dissection. ICA tortuosity with loop.

Vertebral arteries: Left vertebral artery arises from the arch.
Atherosclerosis at the ostium with luminal measurement limited by
motion artifact. Subjectively this stenosis is mild. Otherwise the
vertebral artery is are diffusely patent and smooth. Moderate plaque
on the proximal left subclavian artery, approximately 40% stenosis.

Skeleton: Disc degeneration.  No acute or aggressive finding.

Other neck: No acute finding.

Upper chest: Few emphysematous spaces.

Review of the MIP images confirms the above findings

CTA HEAD FINDINGS

Anterior circulation: Scattered calcified plaque on the carotid
siphons. Hypoplastic right A1 segment. No emergent large vessel
occlusion. No identified branch occlusion or flow limiting stenosis.

Posterior circulation: Hypoplastic P1 segments with small basilar.
Circle-of-Willis is intact. No major branch occlusion, flow limiting
stenosis, or notable atheromatous changes. Negative for aneurysm.

Venous sinuses: Patent

Anatomic variants: Right A1 and bilateral P1 hypoplasia.

Delayed phase: No abnormal intracranial enhancement.

Review of the MIP images confirms the above findings
IMPRESSION: 1. Negative for emergent large vessel occlusion.
2. No cervical or intracranial flow limiting stenosis noted.
3. 
50% right brachiocephalic origin stenosis. 
40% left
subclavian origin stenosis.
4. Left vertebral artery arises from the arch. Atheromatous
narrowing at the origin, with stenosis estimation limited by motion.

## 2018-12-29 IMAGING — MR MR HEAD W/O CM
10 series · 48 of 48 positions shown · non-contrast
Comparison: CT HEAD February 26, 2017 at 5857 hours

CLINICAL DATA: Intermittent headache for week. Severe headache,
nausea, LEFT facial droop and LEFT extremity numbness this morning.
History of complex migraine and TIA.

EXAM:
MRI HEAD WITHOUT CONTRAST
TECHNIQUE: Multiplanar, multiecho pulse sequences of the brain and surrounding
structures were obtained without intravenous contrast.

[Series 2: T1 · sagittal · 5.0mm · 0.45mm/px · 3 of 27 slices shown (1 of 2)]
[im 1/27]
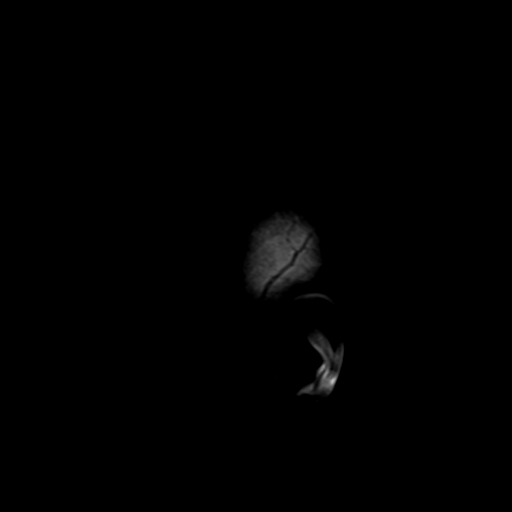
[im 14/27]
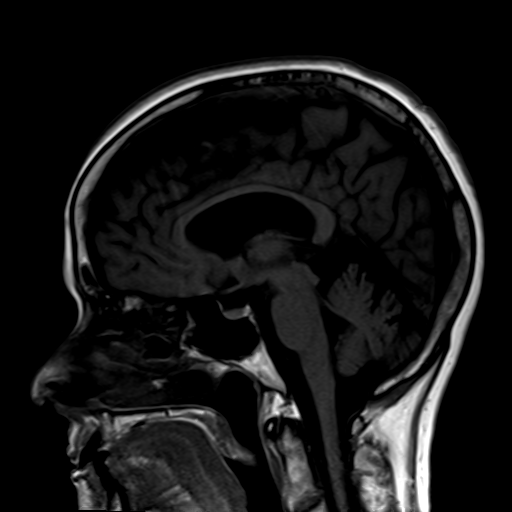
[im 27/27]
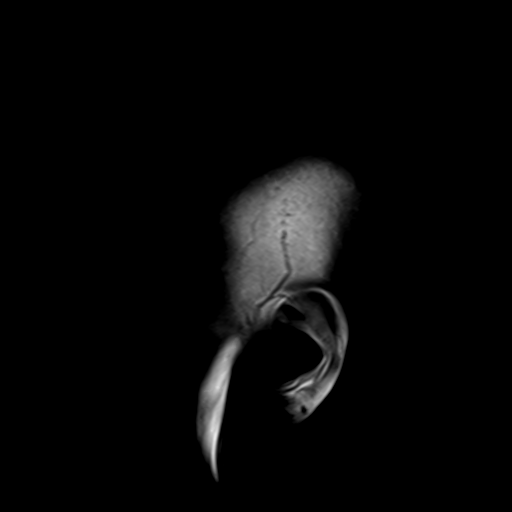

[Series 4: DWI · axial · 3.0mm · 1.80mm/px · z∈[-76,+84]mm · 5 of 54 slices shown (1 of 2)]
[im 1/54]
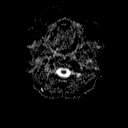
[im 14/54]
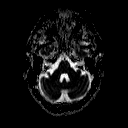
[im 27/54]
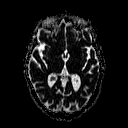
[im 40/54]
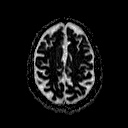
[im 54/54]
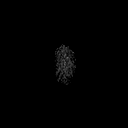

[Series 6: DWI · coronal · 3.0mm · 1.80mm/px · 4 of 44 slices shown (2 of 2)]
[im 1/44]
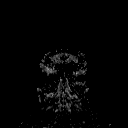
[im 15/44]
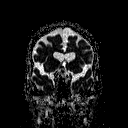
[im 29/44]
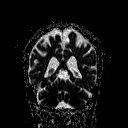
[im 44/44]
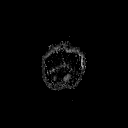

[Series 7: T2 · axial · 5.0mm · 0.60mm/px · z∈[-72,+83]mm · 2 of 25 slices shown (1 of 3)]
[im 1/25]
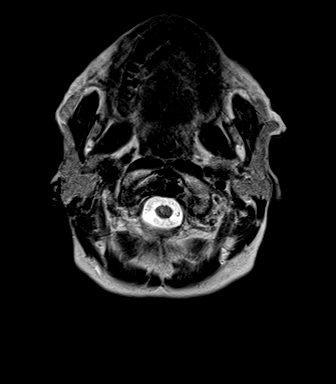
[im 25/25]
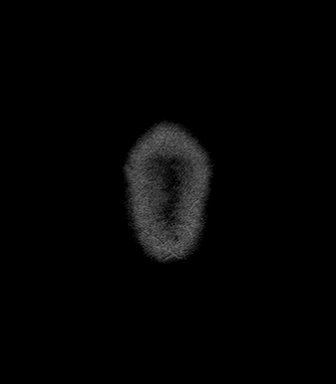

[Series 9: T1 · axial · 1.0mm · 1.00mm/px · z∈[-82,+91]mm · 16 of 176 slices shown (2 of 2)]
[im 1/176]
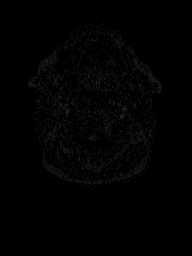
[im 12/176]
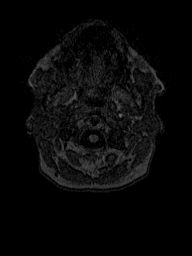
[im 24/176]
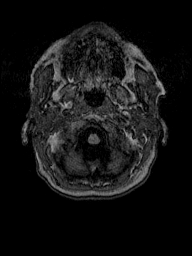
[im 36/176]
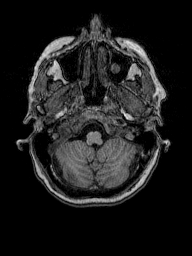
[im 47/176]
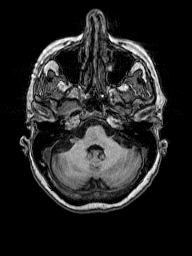
[im 59/176]
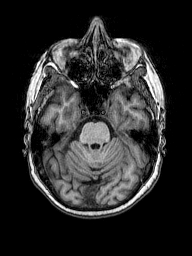
[im 71/176]
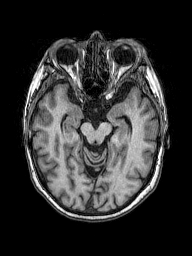
[im 82/176]
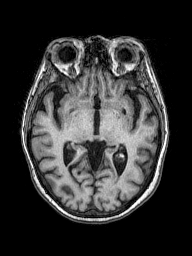
[im 94/176]
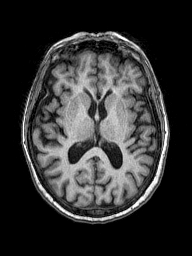
[im 106/176]
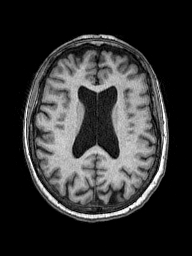
[im 117/176]
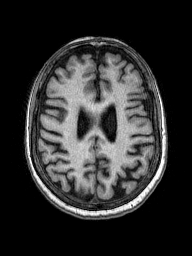
[im 129/176]
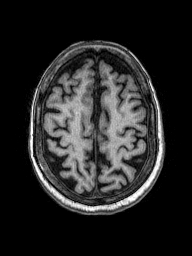
[im 141/176]
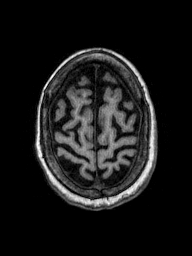
[im 152/176]
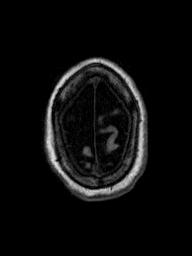
[im 164/176]
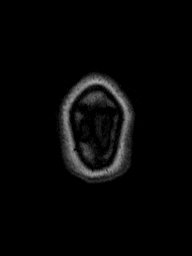
[im 176/176]
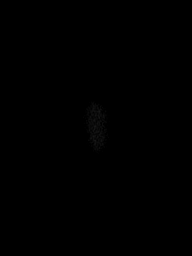

[Series 10: T2 · axial · 5.0mm · 0.45mm/px · z∈[-72,+83]mm · 2 of 25 slices shown (2 of 3)]
[im 1/25]
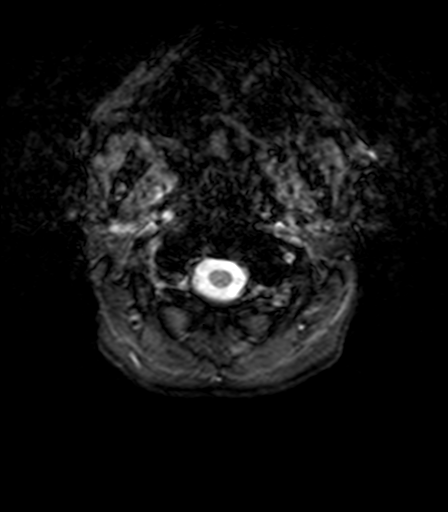
[im 25/25]
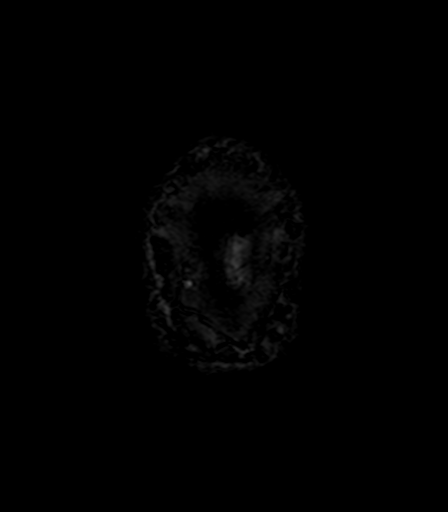

[Series 11: T2 · coronal · 5.0mm · 0.49mm/px · 2 of 27 slices shown (3 of 3)]
[im 1/27]
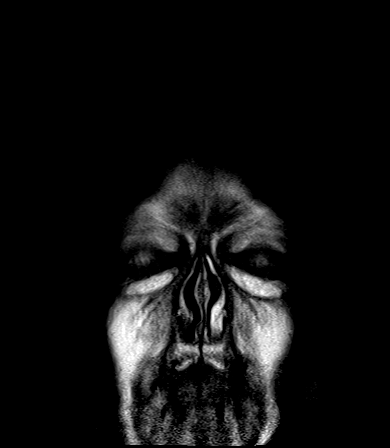
[im 27/27]
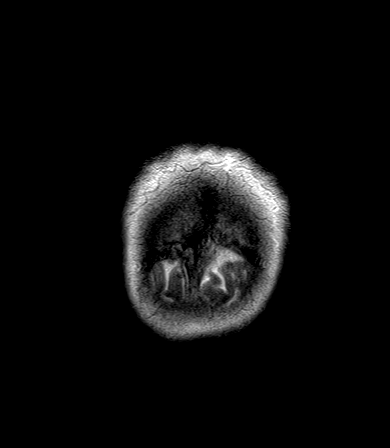

[Series 12: FLAIR · axial · 3.0mm · 0.45mm/px · z∈[-72,+83]mm · 5 of 53 slices shown]
[im 1/53]
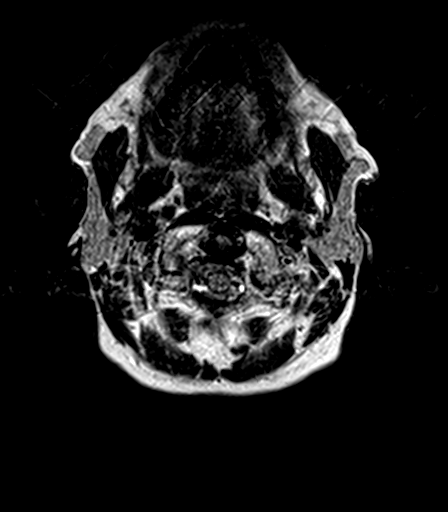
[im 14/53]
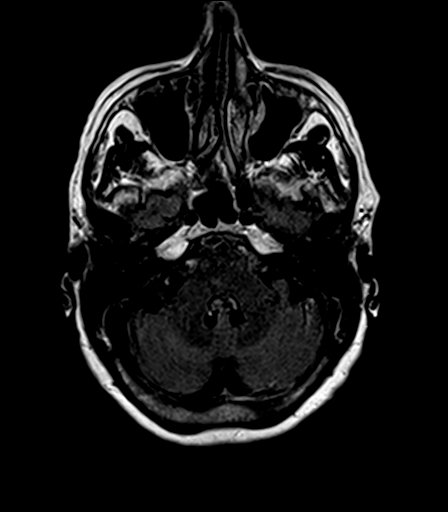
[im 27/53]
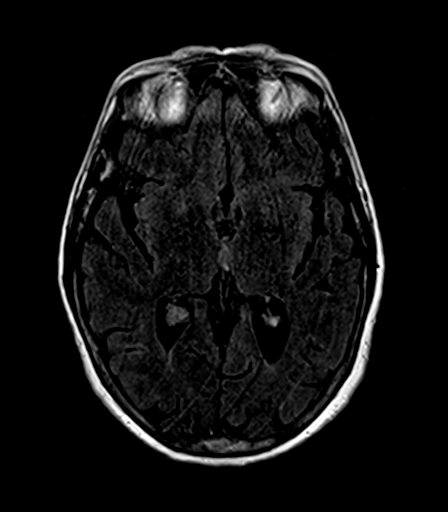
[im 40/53]
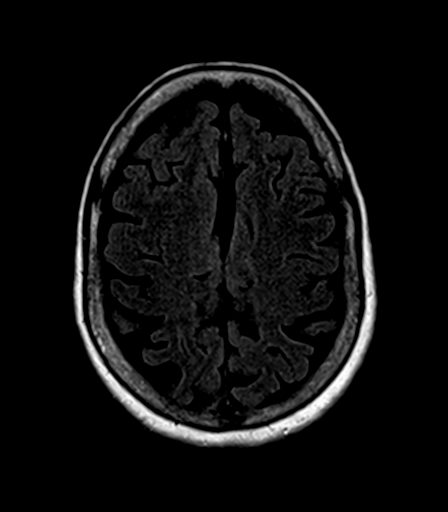
[im 53/53]
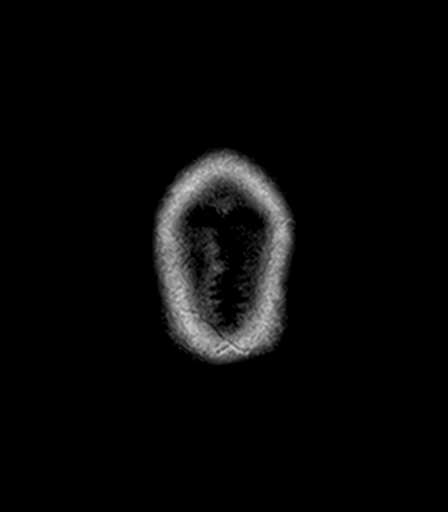

[Series 100: (id) · axial · 3.0mm · 1.80mm/px · z∈[-76,+84]mm · 5 of 55 slices shown (1 of 2)]
[im 1/55]
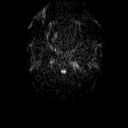
[im 14/55]
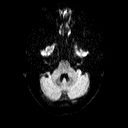
[im 28/55]
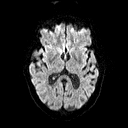
[im 41/55]
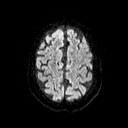
[im 55/55]
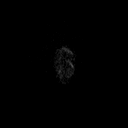

[Series 101: (id) · coronal · 3.0mm · 1.80mm/px · 4 of 44 slices shown (2 of 2)]
[im 1/44]
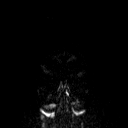
[im 15/44]
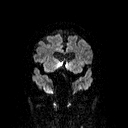
[im 29/44]
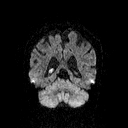
[im 44/44]
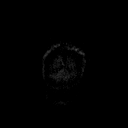

[48 of 48 positions shown; findings below may reference images not displayed]

FINDINGS: BRAIN: No reduced diffusion to suggest acute ischemia. No
susceptibility artifact to suggest hemorrhage. The ventricles and
sulci are mildly mild a prominent for patient's age. A few scattered
subcentimeter supratentorial white matter FLAIR T2 hyperintensities
most compatible chronic small vessel ischemic disease, less than
expected for age. No suspicious parenchymal signal, masses or mass
effect. No abnormal extra-axial fluid collections.

VASCULAR: Normal major intracranial vascular flow voids present at
skull base.

SKULL AND UPPER CERVICAL SPINE: No abnormal sellar expansion. No
suspicious calvarial bone marrow signal. Craniocervical junction
maintained.

SINUSES/ORBITS: Minimal paranasal sinus mucosal thickening,
bilateral maxillary mucosal retention cysts. Mastoid air cells are
well aerated. The included ocular globes and orbital contents are
non-suspicious.

OTHER: None.
IMPRESSION: No acute intracranial process.

Mild parenchymal brain volume loss for age.

## 2018-12-29 IMAGING — CT CT HEAD CODE STROKE
3 series · 15 of 44 positions shown, 18 images · non-contrast
Comparison: None.

CLINICAL DATA: Code stroke.  Left-sided weakness.  Headache.

EXAM:
CT HEAD WITHOUT CONTRAST
TECHNIQUE: Contiguous axial images were obtained from the base of the skull
through the vertex without intravenous contrast.

[Series 2: head wo · axial · 0.41mm/px · z∈[-121,-11]mm · 9 of 27 slices shown, 12 images]
[im 3/27  brain]
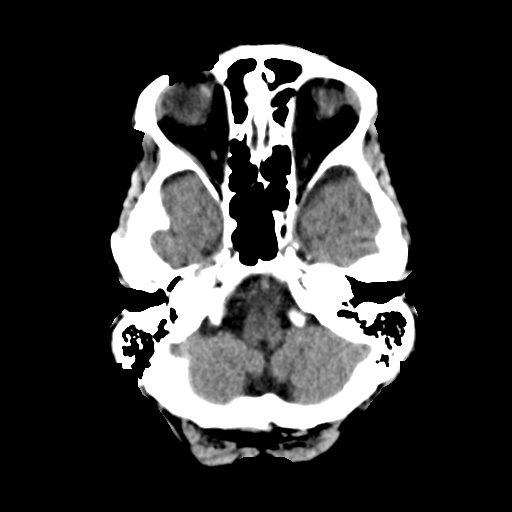
[im 3/27  bone]
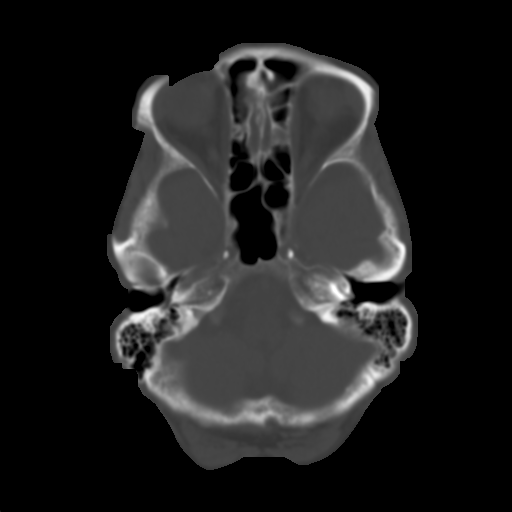
[im 6/27  brain]
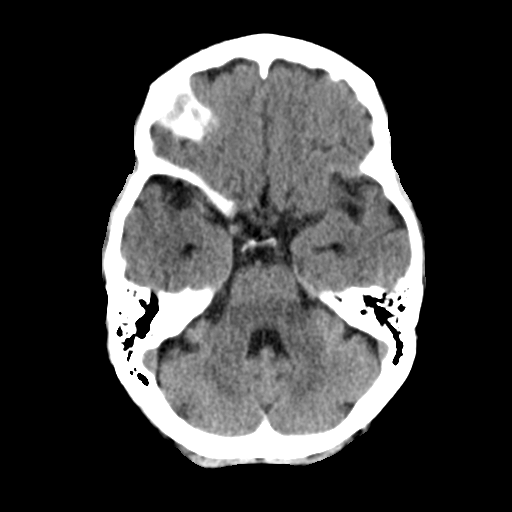
[im 8/27  brain]
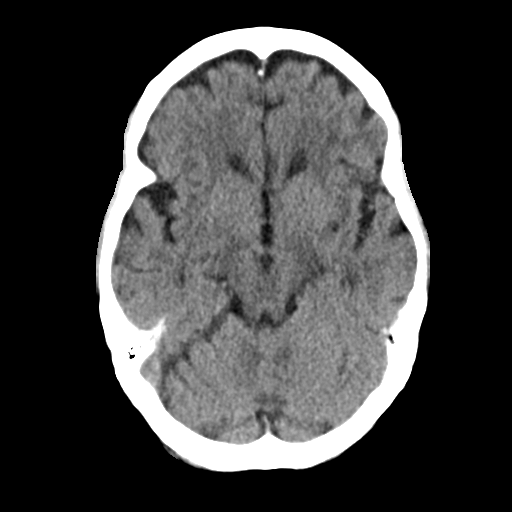
[im 11/27  brain]
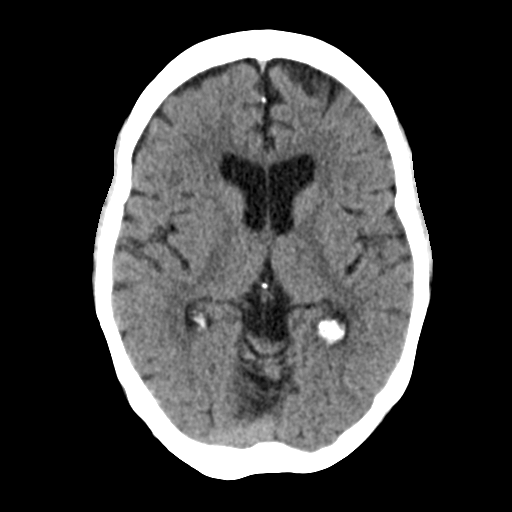
[im 14/27  brain]
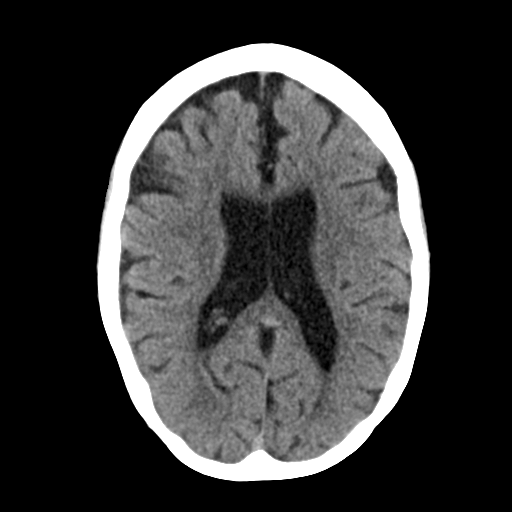
[im 14/27  bone]
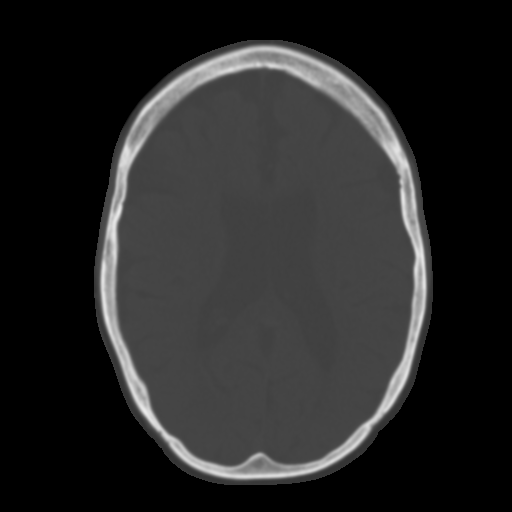
[im 17/27  brain]
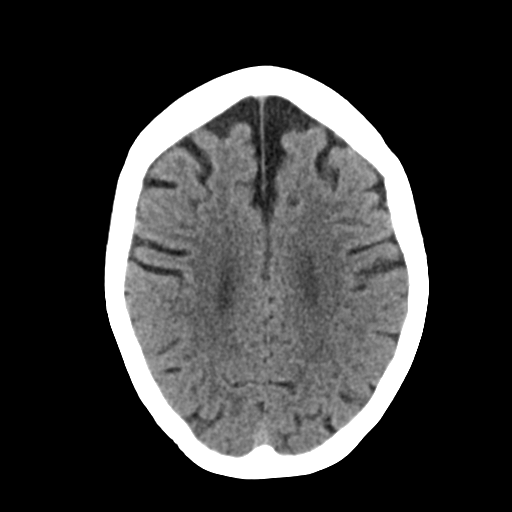
[im 20/27  brain]
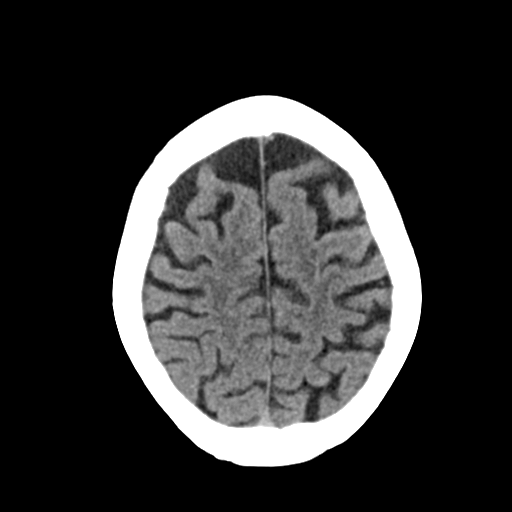
[im 22/27  brain]
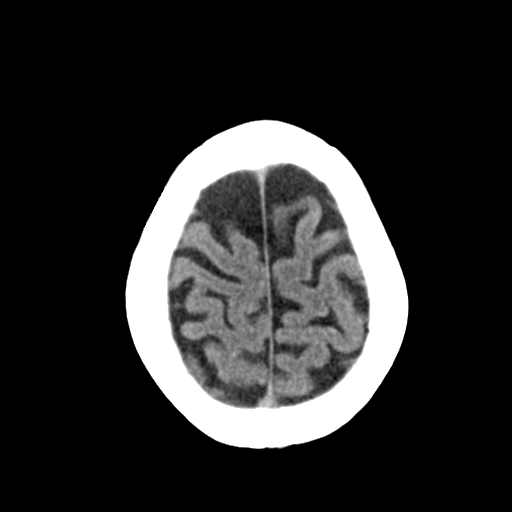
[im 25/27  brain]
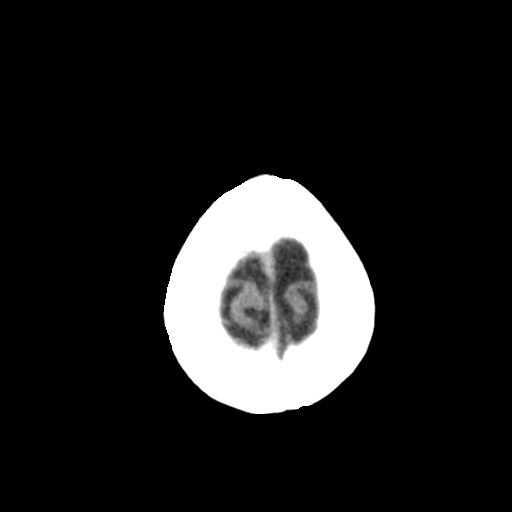
[im 25/27  bone]
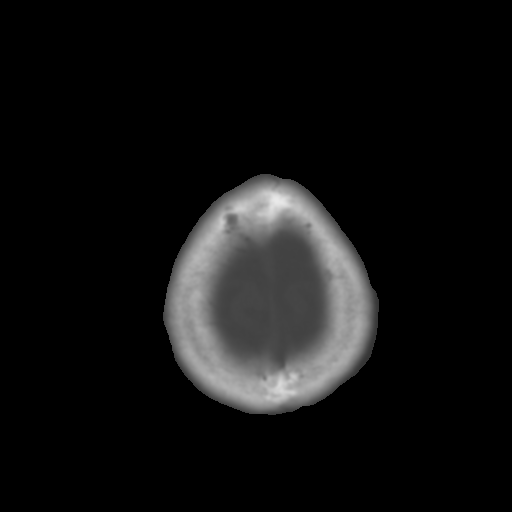

[Series 4: coronal soft tissue · coronal · 0.29mm/px · 3 of 66 slices shown]
[im 22/66  brain]
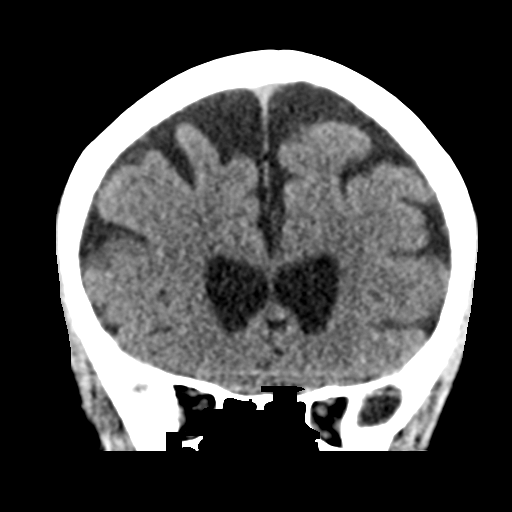
[im 29/66  brain]
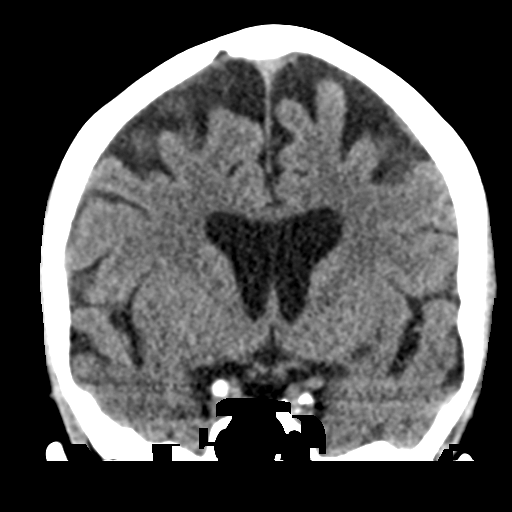
[im 37/66  brain]
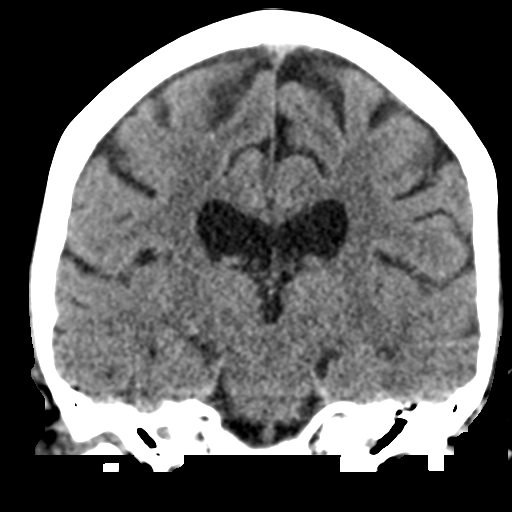

[Series 5: sagittal soft tissue · sagittal · 0.29mm/px · 3 of 50 slices shown]
[im 17/50  brain]
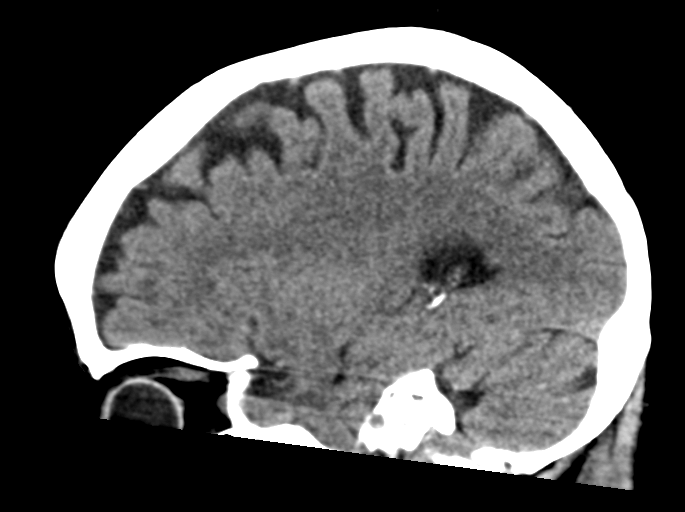
[im 25/50  brain]
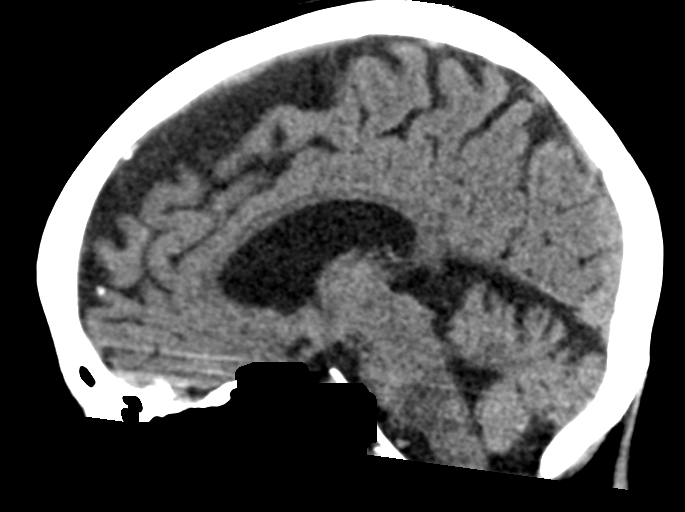
[im 33/50  brain]
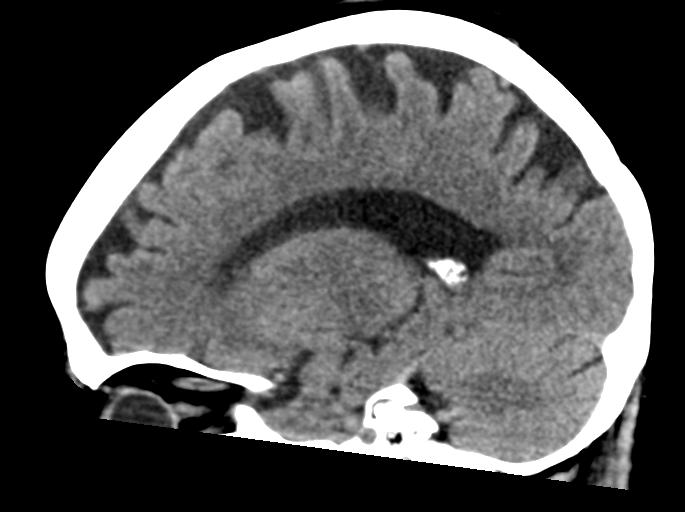

[15 of 44 positions shown; findings below may reference images not displayed]

FINDINGS: Brain: No evidence of acute infarction, hemorrhage, hydrocephalus,
extra-axial collection or mass lesion/mass effect.

Vascular: No hyperdense vessel.  Atherosclerotic calcification.

Skull: Normal. Negative for fracture or focal lesion.

Sinuses/Orbits: No acute finding.

Other: These results were called by telephone at the time of
interpretation on 02/26/2017 at [DATE] to Dr. WIENNY TEKNIK SIPIL , who
verbally acknowledged these results.

ASPECTS (Alberta Stroke Program Early CT Score)

- Ganglionic level infarction (caudate, lentiform nuclei, internal
capsule, insula, M1-M3 cortex): 7

- Supraganglionic infarction (M4-M6 cortex): 3

Total score (0-10 with 10 being normal): 10
IMPRESSION: No acute finding. ASPECTS is 10.
